# Patient Record
Sex: Female | Born: 1935 | Race: Black or African American | Hispanic: No | Smoking: Never smoker
Health system: Southern US, Community
[De-identification: ages and names within clinical notes are randomized; demographics above are authoritative.]

## PROBLEM LIST (undated history)

## (undated) ENCOUNTER — Emergency Department (HOSPITAL_BASED_OUTPATIENT_CLINIC_OR_DEPARTMENT_OTHER): Discharge status: Absent without leave | Payer: Medicare Other

## (undated) ENCOUNTER — Emergency Department (HOSPITAL_BASED_OUTPATIENT_CLINIC_OR_DEPARTMENT_OTHER): Disposition: A | Discharge status: Home / Self Care | Payer: Medicare Other

## (undated) DIAGNOSIS — C50919 Malignant neoplasm of unspecified site of unspecified female breast: Secondary | ICD-10-CM

## (undated) DIAGNOSIS — I10 Essential (primary) hypertension: Secondary | ICD-10-CM

## (undated) HISTORY — PX: BREAST LUMPECTOMY: SHX2

---

## 2011-06-27 ENCOUNTER — Emergency Department (HOSPITAL_BASED_OUTPATIENT_CLINIC_OR_DEPARTMENT_OTHER)
Admission: EM | Admit: 2011-06-27 | Discharge: 2011-06-28 | Disposition: A | Discharge status: Home / Self Care | Payer: Medicare Other | Attending: Emergency Medicine | Admitting: Emergency Medicine

## 2011-06-27 DIAGNOSIS — R040 Epistaxis: Secondary | ICD-10-CM

## 2011-06-27 DIAGNOSIS — I10 Essential (primary) hypertension: Secondary | ICD-10-CM | POA: Insufficient documentation

## 2011-06-27 HISTORY — DX: Essential (primary) hypertension: I10

## 2011-06-27 MED ORDER — OXYMETAZOLINE HCL 0.05 % NA SOLN
2.0000 | Freq: Once | NASAL | Status: DC
  Filled 2011-06-27: qty 15

## 2011-06-27 NOTE — ED Notes (Signed)
Pt states that she experienced a nosebleed today with postural drainage (of blood) and states that there was a large blood clot and then the blood stopped.  Pt does not have any bleeding occuring at this time.  In nad, family at side in triage.

## 2011-06-27 NOTE — ED Provider Notes (Signed)
Scribed for Hanley Seamen, MD, the patient was seen in room MH02/MH02 . This chart was scribed by Ellie Lunch. This patient's care was started at 11:49 PM.   CSN: [id number] Arrival date & time: 06/27/2011 11:39 PM  Chief Complaint  Patient presents with  . Epistaxis   HPI  Jane Cooper is a 77 y.o. female who presents to the Emergency Department complaining of epitaxis. Pt reports her nose began to bleed 2 hour ago(22:00) from both nostrils. She applied pressure and the bleeding stopped. The bleeding started again shortly after. Pt says she could feel blood flowing down her throat and states she removed a large blood clot from her throat. Following the clot, bleeding resolved and has not started again.  Pt denies swallowing blood. Patient denies n/v/d, shortness of breath or chest pain.      Past Medical History  Diagnosis Date  . Hypertension     History reviewed. No pertinent past surgical history.  MEDICATIONS:  Previous Medications   HYDROCHLOROTHIAZIDE (,MICROZIDE/HYDRODIURIL,) 12.5 MG CAPSULE    Take 12.5 mg by mouth daily.     UNKNOWN TO PATIENT    Pt states that she takes a medication for "my bones" once a week.      ALLERGIES:  Allergies as of 06/27/2011  . (No Known Allergies)    History reviewed. No pertinent family history.  History  Substance Use Topics  . Smoking status: Never Smoker   . Smokeless tobacco: Never Used  . Alcohol Use: No      Review of Systems  Respiratory: Negative for shortness of breath.   Cardiovascular: Negative for chest pain.  Gastrointestinal: Negative for nausea, vomiting and diarrhea.  All other systems reviewed and are negative.    Physical Exam  BP 151/84  Pulse 72  Temp(Src) 98.2 F (36.8 C) (Oral)  Resp 20  Ht 5' 5.5" (1.664 m)  Wt 135 lb (61.236 kg)  BMI 22.12 kg/m2  SpO2 96%  Physical Exam  Nursing note and vitals reviewed. Constitutional: She is oriented to person, place, and time. She appears  well-developed and well-nourished. No distress.  HENT:  Head: Normocephalic and atraumatic.       Slight irritation left turbinant.   Eyes: EOM are normal.       Bilateral arcus senilis   Neck: Normal range of motion.  Cardiovascular: Normal rate, regular rhythm, normal heart sounds and intact distal pulses.   Pulmonary/Chest: Effort normal and breath sounds normal.  Abdominal: Soft. There is no tenderness.  Musculoskeletal: Normal range of motion. She exhibits no edema.  Neurological: She is alert and oriented to person, place, and time.  Skin: Skin is warm and dry.  Psychiatric: She has a normal mood and affect. Her behavior is normal.   OTHER DATA REVIEWED: Nursing notes and vital signs reviewed.   DIAGNOSTIC STUDIES: Oxygen Saturation is 96% on room air, adequate by my interpretation.     ED COURSE / COORDINATION OF CARE: 11:52 PM Discussed pt care at bedside.    MDM:    PLAN:  Home  The patient is to return the emergency department if there is any worsening of symptoms. I have reviewed the discharge instructions with the patient.  CONDITION ON DISCHARGE: Stable   Procedures      12:45 AM Hemostatic after instillation of oxymetazoline. Patient will be sent home with oxymetazoline with instructions for use should bleeding recur. She is unable to achieve hemostasis she was advised to return.  Lary Eckardt L Ezrah Dembeck,  MD 06/28/11 [id number]

## 2019-03-11 ENCOUNTER — Emergency Department (HOSPITAL_BASED_OUTPATIENT_CLINIC_OR_DEPARTMENT_OTHER)
Admission: EM | Admit: 2019-03-11 | Discharge: 2019-03-11 | Disposition: A | Discharge status: Home / Self Care | Payer: Medicare Other | Attending: Emergency Medicine | Admitting: Emergency Medicine

## 2019-03-11 ENCOUNTER — Encounter (HOSPITAL_BASED_OUTPATIENT_CLINIC_OR_DEPARTMENT_OTHER): Payer: Self-pay | Admitting: Emergency Medicine

## 2019-03-11 ENCOUNTER — Other Ambulatory Visit: Payer: Self-pay

## 2019-03-11 DIAGNOSIS — J029 Acute pharyngitis, unspecified: Secondary | ICD-10-CM | POA: Diagnosis present

## 2019-03-11 DIAGNOSIS — K209 Esophagitis, unspecified without bleeding: Secondary | ICD-10-CM

## 2019-03-11 DIAGNOSIS — I10 Essential (primary) hypertension: Secondary | ICD-10-CM | POA: Diagnosis not present

## 2019-03-11 DIAGNOSIS — H9319 Tinnitus, unspecified ear: Secondary | ICD-10-CM | POA: Insufficient documentation

## 2019-03-11 DIAGNOSIS — R42 Dizziness and giddiness: Secondary | ICD-10-CM | POA: Diagnosis not present

## 2019-03-11 MED ORDER — LIDOCAINE VISCOUS HCL 2 % MT SOLN
15.0000 mL | Freq: Once | OROMUCOSAL | Status: AC
  Administered 2019-03-11: 01:00:00 15 mL via OROMUCOSAL
  Filled 2019-03-11: qty 15

## 2019-03-11 NOTE — ED Provider Notes (Signed)
Emergency Department Provider Note   I have reviewed the triage vital signs and the nursing notes.   HISTORY  Chief Complaint Sore Throat and Tinnitus   HPI Jane Cooper is a 85 y.o. female with a history of hypertension and reflux who presents the emergency department today with multiple complaints.  The primary reason for coming is that she will intermittently feel a lump in her throat.  She states is not necessarily associated with swallowing or breathing but every once while she has had this sensation of a foreign body.  She is able to swallow without difficulty.  She not had any nausea, vomiting.  She has been treated for reflux recently with omeprazole which seems to have helped the indigestion symptoms and somewhat the throat stuff as well.  She intermittently have some sinus drainage but nothing persistent.  Over the last couple weeks she had a couple episodes of what sounds like near syncope where she feels really lightheaded has little bit of tinnitus but never actually passes out.  This is not persistent nor is it every day.  Had some chest pressure a week or 2 ago but this is not consistent either.  No fevers or cough.  No sick contacts.  No other abdominal symptoms.  When referencing her hypertension patient does not have severe headache, vision changes, dyspnea or decrease in urination.   No other associated or modifying symptoms.    Past Medical History:  Diagnosis Date  . Hypertension     There are no active problems to display for this patient.   History reviewed. No pertinent surgical history.  Current Outpatient Rx  . Order #: [id number] Class: Historical Med  . Order #: [id number] Class: Historical Med    Allergies Patient has no known allergies.  History reviewed. No pertinent family history.  Social History Social History   Tobacco Use  . Smoking status: Never Smoker  . Smokeless tobacco: Never Used  Substance Use Topics  . Alcohol use: No  . Drug  use: No    Review of Systems  All other systems negative except as documented in the HPI. All pertinent positives and negatives as reviewed in the HPI. ____________________________________________   PHYSICAL EXAM:  VITAL SIGNS: ED Triage Vitals  Enc Vitals Group     BP 03/11/19 0113 (!) 163/87     Pulse Rate 03/11/19 0113 68     Resp 03/11/19 0113 18     Temp 03/11/19 0113 98.1 F (36.7 C)     Temp Source 03/11/19 0113 Oral     SpO2 03/11/19 0113 100 %     Weight 03/11/19 0114 115 lb (52.2 kg)     Height 03/11/19 0114 5\' 5"  (1.651 m)     Head Circumference --      Peak Flow --      Pain Score 03/11/19 0114 0     Pain Loc --      Pain Edu? --      Excl. in [place]? --     Constitutional: Alert and oriented. Well appearing and in no acute distress. Eyes: Conjunctivae are normal. PERRL. EOMI. Head: Atraumatic. Nose: No congestion/rhinnorhea. Mouth/Throat/Sinus: Mucous membranes are moist.  Oropharynx non-erythematous. No sinus ttp. No throat fullness, ttp or thyromegaly. Neck: No stridor.  No meningeal signs.   Cardiovascular: Normal rate, regular rhythm. Good peripheral circulation. Grossly normal heart sounds.   Respiratory: Normal respiratory effort.  No retractions. Lungs CTAB. Gastrointestinal: Soft and nontender. No distention.  Musculoskeletal: No  lower extremity tenderness nor edema. No gross deformities of extremities. Neurologic:  Normal speech and language. No gross focal neurologic deficits are appreciated. No altered mental status, able to give full seemingly accurate history.  Face is symmetric, EOM's intact, pupils equal and reactive, vision intact, tongue and uvula midline without deviation. Upper and Lower extremity motor 5/5, intact pain perception in distal extremities, 2+ reflexes in biceps, patella and achilles tendons. Able to perform finger to nose normal with both hands. Walks without assistance or evident ataxia.  Skin:  Skin is warm, dry and intact. No  rash noted.  ____________________________________________   EKG   EKG Interpretation  Date/Time:  Sunday Mar 11 2019 01:37:50 EDT Ventricular Rate:  56 PR Interval:    QRS Duration: 99 QT Interval:  450 QTC Calculation: 435 R Axis:   -32 Text Interpretation:  Sinus rhythm Prolonged PR interval Probable left atrial enlargement Left ventricular hypertrophy No significant change was found Confirmed by Merrily Pew [id number]) on 03/11/2019 1:41:34 AM      ____________________________________________  INITIAL IMPRESSION / ASSESSMENT AND PLAN / ED COURSE  Suspect esophagitis from long standing indigestion. ECG 2/2 near syncope and vague CP/neck pain and appears normal. Will dc to fu w/ GI if not improving.   Pertinent labs & imaging results that were available during my care of the patient were reviewed by me and considered in my medical decision making (see chart for details).  A medical screening exam was performed and I feel the patient has had an appropriate workup for their chief complaint at this time and likelihood of emergent condition existing is low. They have been counseled on decision, discharge, follow up and which symptoms necessitate immediate return to the emergency department. They or their family verbally stated understanding and agreement with plan and discharged in stable condition.   ____________________________________________  FINAL CLINICAL IMPRESSION(S) / ED DIAGNOSES  Final diagnoses:  Esophagitis    MEDICATIONS GIVEN DURING THIS VISIT:  Medications  lidocaine (XYLOCAINE) 2 % viscous mouth solution 15 mL (15 mLs Mouth/Throat Given 03/11/19 0121)     NEW OUTPATIENT MEDICATIONS STARTED DURING THIS VISIT:  Discharge Medication List as of 03/11/2019  1:47 AM      Note:  This note was prepared with assistance of Dragon voice recognition software. Occasional wrong-word or sound-a-like substitutions may have occurred due to the inherent limitations of voice  recognition software.   Nuvia Hileman, Corene Cornea, MD 03/11/19 [phone]

## 2019-03-11 NOTE — ED Triage Notes (Signed)
  Patient comes in with throat pain and ringing in her ears.  Patient states she feels like something is stuck in her throat but does not have difficulty eating or drinking.  Patient was recently treated for reflux and prescribed omeprazole. Patient states she has not been febrile or around anyone that has been sick.  Patient is A&O x4.

## 2019-06-17 ENCOUNTER — Encounter (HOSPITAL_BASED_OUTPATIENT_CLINIC_OR_DEPARTMENT_OTHER): Payer: Self-pay | Admitting: Emergency Medicine

## 2019-06-17 ENCOUNTER — Emergency Department (HOSPITAL_BASED_OUTPATIENT_CLINIC_OR_DEPARTMENT_OTHER)
Admission: EM | Admit: 2019-06-17 | Discharge: 2019-06-17 | Disposition: A | Discharge status: Home / Self Care | Payer: Medicare Other | Attending: Emergency Medicine | Admitting: Emergency Medicine

## 2019-06-17 ENCOUNTER — Emergency Department (HOSPITAL_BASED_OUTPATIENT_CLINIC_OR_DEPARTMENT_OTHER): Payer: Medicare Other

## 2019-06-17 ENCOUNTER — Other Ambulatory Visit: Payer: Self-pay

## 2019-06-17 DIAGNOSIS — H6121 Impacted cerumen, right ear: Secondary | ICD-10-CM | POA: Insufficient documentation

## 2019-06-17 DIAGNOSIS — I10 Essential (primary) hypertension: Secondary | ICD-10-CM | POA: Insufficient documentation

## 2019-06-17 DIAGNOSIS — Z79899 Other long term (current) drug therapy: Secondary | ICD-10-CM | POA: Diagnosis not present

## 2019-06-17 DIAGNOSIS — H9201 Otalgia, right ear: Secondary | ICD-10-CM

## 2019-06-17 DIAGNOSIS — Z853 Personal history of malignant neoplasm of breast: Secondary | ICD-10-CM | POA: Insufficient documentation

## 2019-06-17 HISTORY — DX: Malignant neoplasm of unspecified site of unspecified female breast: C50.919

## 2019-06-17 MED ORDER — NEOMYCIN-POLYMYXIN-HC 3.5-10000-1 OT SUSP: 4.0000 [drp] | Freq: Three times a day (TID) | OTIC | 0 refills | Status: AC

## 2019-06-17 MED ORDER — ACETAMINOPHEN 500 MG PO TABS
1000.0000 mg | ORAL_TABLET | Freq: Once | ORAL | Status: AC
  Administered 2019-06-17: 1000 mg via ORAL
  Filled 2019-06-17: qty 2

## 2019-06-17 NOTE — ED Triage Notes (Signed)
Pt sts RT ear began hurting Fri; it doesn't hurt as much now, but it makes her feel funny in her head sometimes; denies sxs at present, sts sxs are intermittent

## 2019-06-17 NOTE — ED Notes (Signed)
Patient transported to CT 

## 2019-06-17 NOTE — ED Provider Notes (Signed)
[facility] EMERGENCY DEPARTMENT Provider Note   CSN: [id number] Arrival date & time: 06/17/19  1000     History   Chief Complaint Chief Complaint  Patient presents with  . Otalgia    HPI Jane Cooper is a 85 y.o. female.     The history is provided by the patient.  Otalgia Location:  Right Behind ear:  No abnormality Quality:  Aching and shooting Severity:  Moderate Onset quality:  Gradual Timing:  Constant Progression:  Unchanged Chronicity:  New Context: not recent URI and not water in ear   Relieved by:  Nothing Worsened by:  Nothing Ineffective treatments:  None tried Associated symptoms: no abdominal pain, no congestion, no cough, no diarrhea, no ear discharge, no fever, no headaches, no hearing loss, no neck pain, no rash, no rhinorrhea, no sore throat, no tinnitus and no vomiting   Risk factors: no recent travel   Head and ear feel full.  No tinnitus.  No weakness no numbness no changes in vision or speech.  No f/c/r/.  No headache.  Chest pain no shortness of breath.    Past Medical History:  Diagnosis Date  . Breast cancer ([place])   . Hypertension     There are no active problems to display for this patient.   Past Surgical History:  Procedure Laterality Date  . BREAST LUMPECTOMY Right      OB History   No obstetric history on file.      Home Medications    Prior to Admission medications   Medication Sig Start Date End Date Taking? Authorizing Provider  omeprazole (PRILOSEC) 20 MG capsule TAKE 1 CAPSULE BY MOUTH EVERY DAY IN THE MORNING 30 MINUTES BEFORE BREAKFAST 04/11/19  Yes [provider]  Cholecalciferol (VITAMIN D3) 25 MCG (1000 UT) CAPS Take by mouth.    [provider]  hydrochlorothiazide (,MICROZIDE/HYDRODIURIL,) 12.5 MG capsule Take 12.5 mg by mouth daily.      [provider]  letrozole (FEMARA) 2.5 MG tablet Take 2.5 mg by mouth daily. 01/19/19   [provider]  UNKNOWN TO PATIENT Pt  states that she takes a medication for "my bones" once a week.     [provider]    Family History No family history on file.  Social History Social History   Tobacco Use  . Smoking status: Never Smoker  . Smokeless tobacco: Never Used  Substance Use Topics  . Alcohol use: No  . Drug use: No     Allergies   Patient has no known allergies.   Review of Systems Review of Systems  Constitutional: Negative for fever.  HENT: Positive for ear pain. Negative for congestion, ear discharge, hearing loss, rhinorrhea, sore throat and tinnitus.   Eyes: Negative for photophobia and visual disturbance.  Respiratory: Negative for cough.   Cardiovascular: Negative for chest pain.  Gastrointestinal: Negative for abdominal pain, diarrhea and vomiting.  Musculoskeletal: Negative for neck pain.  Skin: Negative for rash.  Neurological: Negative for dizziness, tremors, seizures, syncope, facial asymmetry, speech difficulty, weakness, light-headedness, numbness and headaches.  All other systems reviewed and are negative.    Physical Exam Updated Vital Signs BP 138/85 (BP Location: Right Arm)   Pulse 81   Temp 98 F (36.7 C) (Oral)   Resp 18   Ht 5' 5.5" (1.664 m)   Wt 49.5 kg   SpO2 100%   BMI 17.89 kg/m   Physical Exam Vitals signs and nursing note reviewed.  Constitutional:  General: She is not in acute distress.    Appearance: She is normal weight.  HENT:     Head: Normocephalic and atraumatic.     Left Ear: Tympanic membrane normal.     Ears:     Comments: Impaction in the right canal.  Canal is mildly red    Nose: Nose normal.     Mouth/Throat:     Mouth: Mucous membranes are moist.     Pharynx: Oropharynx is clear.  Eyes:     Extraocular Movements: Extraocular movements intact.     Conjunctiva/sclera: Conjunctivae normal.     Pupils: Pupils are equal, round, and reactive to light.  Neck:     Musculoskeletal: Normal range of motion and neck supple.   Cardiovascular:     Rate and Rhythm: Normal rate and regular rhythm.     Pulses: Normal pulses.     Heart sounds: Normal heart sounds.  Pulmonary:     Effort: Pulmonary effort is normal.     Breath sounds: Normal breath sounds.  Abdominal:     General: Abdomen is flat. Bowel sounds are normal.     Tenderness: There is no abdominal tenderness. There is no guarding.  Musculoskeletal: Normal range of motion.  Skin:    General: Skin is warm and dry.     Capillary Refill: Capillary refill takes less than 2 seconds.  Neurological:     General: No focal deficit present.     Mental Status: She is alert and oriented to person, place, and time.     Cranial Nerves: No cranial nerve deficit.     Motor: No weakness.     Gait: Gait normal.     Deep Tendon Reflexes: Reflexes normal.  Psychiatric:        Mood and Affect: Mood normal.        Behavior: Behavior normal.      ED Treatments / Results  Labs (all labs ordered are listed, but only abnormal results are displayed) Labs Reviewed - No data to display  EKG None  Radiology No results found.  Procedures Procedures (including critical care time)  Medications Ordered in ED Medications  acetaminophen (TYLENOL) tablet 1,000 mg (1,000 mg Oral Given 06/17/19 1047)     Irrigated and copious cerumen came out feels better.  I do not believe this is a stroke.  She states the feeling in her ear and head went away post irrigation.  No signs of stroke on exam.  Will start ear drops.    Final Clinical Impressions(s) / ED Diagnoses   Return for intractable cough, coughing up blood,fevers >100.4 unrelieved by medication, shortness of breath, intractable vomiting, chest pain, shortness of breath, weakness,numbness, changes in speech, facial asymmetry,abdominal pain, passing out,Inability to tolerate liquids or food, cough, altered mental status or any concerns. No signs of systemic illness or infection. The patient is nontoxic-appearing  on exam and vital signs are within normal limits.   I have reviewed the triage vital signs and the nursing notes. Pertinent labs &imaging results that were available during my care of the patient were reviewed by me and considered in my medical decision making (see chart for details).  After history, exam, and medical workup I feel the patient has been appropriately medically screened and is safe for discharge home. Pertinent diagnoses were discussed with the patient. Patient was given return precautions   Nayda Riesen, MD 06/17/19 1240

## 2019-06-17 NOTE — ED Notes (Signed)
RT ear irrigated with warm water; sm amt of wax obtained

## 2020-06-16 ENCOUNTER — Encounter (HOSPITAL_BASED_OUTPATIENT_CLINIC_OR_DEPARTMENT_OTHER): Payer: Self-pay | Admitting: *Deleted

## 2020-06-16 ENCOUNTER — Emergency Department (HOSPITAL_BASED_OUTPATIENT_CLINIC_OR_DEPARTMENT_OTHER)
Admission: EM | Admit: 2020-06-16 | Discharge: 2020-06-16 | Disposition: A | Discharge status: Home / Self Care | Payer: Medicare Other | Attending: Emergency Medicine | Admitting: Emergency Medicine

## 2020-06-16 ENCOUNTER — Other Ambulatory Visit: Payer: Self-pay

## 2020-06-16 DIAGNOSIS — I10 Essential (primary) hypertension: Secondary | ICD-10-CM | POA: Diagnosis not present

## 2020-06-16 DIAGNOSIS — Z853 Personal history of malignant neoplasm of breast: Secondary | ICD-10-CM | POA: Diagnosis not present

## 2020-06-16 DIAGNOSIS — R202 Paresthesia of skin: Secondary | ICD-10-CM | POA: Insufficient documentation

## 2020-06-16 LAB — CBC WITH DIFFERENTIAL/PLATELET
Abs Immature Granulocytes: 0.01 10*3/uL (ref 0.00–0.07)
Basophils Absolute: 0.1 10*3/uL (ref 0.0–0.1)
Basophils Relative: 1 %
Eosinophils Absolute: 0.1 10*3/uL (ref 0.0–0.5)
Eosinophils Relative: 3 %
HCT: 37.3 % (ref 36.0–46.0)
Hemoglobin: 11.9 g/dL — ABNORMAL LOW (ref 12.0–15.0)
Immature Granulocytes: 0 %
Lymphocytes Relative: 31 %
Lymphs Abs: 1.7 10*3/uL (ref 0.7–4.0)
MCH: 25.5 pg — ABNORMAL LOW (ref 26.0–34.0)
MCHC: 31.9 g/dL (ref 30.0–36.0)
MCV: 80 fL (ref 80.0–100.0)
Monocytes Absolute: 0.6 10*3/uL (ref 0.1–1.0)
Monocytes Relative: 10 %
Neutro Abs: 3.1 10*3/uL (ref 1.7–7.7)
Neutrophils Relative %: 55 %
Platelets: 193 10*3/uL (ref 150–400)
RBC: 4.66 MIL/uL (ref 3.87–5.11)
RDW: 15 % (ref 11.5–15.5)
WBC: 5.6 10*3/uL (ref 4.0–10.5)
nRBC: 0 % (ref 0.0–0.2)

## 2020-06-16 LAB — BASIC METABOLIC PANEL
Anion gap: 9 (ref 5–15)
BUN: 17 mg/dL (ref 8–23)
CO2: 25 mmol/L (ref 22–32)
Calcium: 9.4 mg/dL (ref 8.9–10.3)
Chloride: 105 mmol/L (ref 98–111)
Creatinine, Ser: 0.83 mg/dL (ref 0.44–1.00)
GFR calc Af Amer: >60 mL/min (ref >60)
GFR calc non Af Amer: >60 mL/min (ref >60)
Glucose, Bld: 84 mg/dL (ref 70–99)
Potassium: 4.6 mmol/L (ref 3.5–5.1)
Sodium: 139 mmol/L (ref 135–145)

## 2020-06-16 LAB — VITAMIN B12: Vitamin B-12: 592 pg/mL (ref 180–914)

## 2020-06-16 LAB — CBG MONITORING, ED: Glucose-Capillary: 85 mg/dL (ref 70–99)

## 2020-06-16 NOTE — ED Notes (Signed)
D/c IV in L ac

## 2020-06-16 NOTE — ED Triage Notes (Addendum)
Acid reflux. Dry mouth for the past few mornings. Tingling in her feet for a long time per pt. Now she is having trembling in her hands. She drove herself here.

## 2020-06-16 NOTE — Discharge Instructions (Addendum)
Your electrolytes and blood work is reassuring.   Please follow closely with your primary care provider to discuss your visit today. Return for new or concerning symptoms.

## 2020-06-16 NOTE — ED Provider Notes (Signed)
[facility] EM[facility] DEPARTMENT Provider Note   CSN: [id number] Arrival date & time: 06/16/20  1114     History Chief Complaint  Patient presents with  . Multiple Complaints    Jane Cooper is a 86 y.o. female w PMHx GERD, HTN, breast cancer s/p lumpectomy, presenting to the ED with complaint of intermittent full body tingling sensation that has been occurring for a few weeks. She states she has had tingling to b/l for a few years, and a few weeks ago it began occurring intermittently to both arms and torso. She denies associated chest pain, shortness of breath, nausea, diaphoresis, numbness or weakness, difficulty seeing or speaking.  She has not seen her PCP for this.  She denies any new medications or recent illness.  She does endorse dry mouth.  The history is provided by the patient.       Past Medical History:  Diagnosis Date  . Breast cancer ([place])   . Hypertension     There are no problems to display for this patient.   Past Surgical History:  Procedure Laterality Date  . BREAST LUMPECTOMY Right      OB History   No obstetric history on file.     No family history on file.  Social History   Tobacco Use  . Smoking status: Never Smoker  . Smokeless tobacco: Never Used  Substance Use Topics  . Alcohol use: No  . Drug use: No    Home Medications Prior to Admission medications   Medication Sig Start Date End Date Taking? Authorizing Provider  Cholecalciferol (VITAMIN D3) 25 MCG (1000 UT) CAPS Take by mouth.    [provider]  hydrochlorothiazide (,MICROZIDE/HYDRODIURIL,) 12.5 MG capsule Take 12.5 mg by mouth daily.      [provider]  letrozole (FEMARA) 2.5 MG tablet Take 2.5 mg by mouth daily. 01/19/19   [provider]  neomycin-polymyxin-hydrocortisone (CORTISPORIN) 3.5-10000-1 OTIC suspension Place 4 drops into both ears 3 (three) times daily. X 7 days 06/17/19   Palumbo, April, MD  omeprazole (PRILOSEC) 20 MG  capsule TAKE 1 CAPSULE BY MOUTH EVERY DAY IN THE MORNING 30 MINUTES BEFORE BREAKFAST 04/11/19   [provider]  UNKNOWN TO PATIENT Pt states that she takes a medication for "my bones" once a week.     [provider]    Allergies    Patient has no known allergies.  Review of Systems   Review of Systems  All other systems reviewed and are negative.   Physical Exam Updated Vital Signs BP (!) 166/84 (BP Location: Right Arm)   Pulse (!) 54   Temp 98.1 F (36.7 C) (Oral)   Resp 13   Ht 5\' 5"  (1.651 m)   Wt 52.6 kg   SpO2 100%   BMI 19.29 kg/m   Physical Exam Vitals and nursing note reviewed.  Constitutional:      Appearance: She is well-developed.     Comments: Pt is well-appearing, no distress noted  HENT:     Head: Normocephalic and atraumatic.  Eyes:     Conjunctiva/sclera: Conjunctivae normal.  Cardiovascular:     Rate and Rhythm: Normal rate and regular rhythm.     Pulses: Normal pulses.     Comments: Nl DP and radial pulses b/l Pulmonary:     Effort: Pulmonary effort is normal. No respiratory distress.     Breath sounds: Normal breath sounds.  Abdominal:     Palpations: Abdomen is soft.  Musculoskeletal:  Right lower leg: No edema.     Left lower leg: No edema.  Skin:    General: Skin is warm.  Neurological:     Mental Status: She is alert.  Psychiatric:        Behavior: Behavior normal.     ED Results / Procedures / Treatments   Labs (all labs ordered are listed, but only abnormal results are displayed) Labs Reviewed  CBC WITH DIFFERENTIAL/PLATELET - Abnormal; Notable for the following components:      Result Value   Hemoglobin 11.9 (*)    MCH 25.5 (*)    All other components within normal limits  BASIC METABOLIC PANEL  VITAMIN O13  CBG MONITORING, ED    EKG EKG Interpretation  Date/Time:  Monday June 16 2020 14:16:09 EDT Ventricular Rate:  49 PR Interval:    QRS Duration: 96 QT Interval:  483 QTC Calculation: 436 R  Axis:   -7 Text Interpretation: Sinus bradycardia Atrial premature complex Prolonged PR interval No significant change since prior 5/20 Confirmed by Aletta Edouard [phone]) on 06/16/2020 2:18:25 PM   Radiology No results found.  Procedures Procedures (including critical care time)  Medications Ordered in ED Medications - No data to display  ED Course  I have reviewed the triage vital signs and the nursing notes.  Pertinent labs & imaging results that were available during my care of the patient were reviewed by me and considered in my medical decision making (see chart for details).  Clinical Course as of Jun 16 1558  Mon Jun 16, 2020  1416 Pleasant 86 year old with longstanding tingling in her lower extremities now feeling the tingling going up her body and feeling nervous.  Has been taking her medicines regularly.  Getting some screening labs EKG.   [MB]    Clinical Course User Index [MB] Hayden Rasmussen, MD   MDM Rules/Calculators/A&P                          Pt presenting with multiple weeks of intermittent full body tingling sensation.  No accompanying symptoms concerning for emergent cardiac or neurologic etiology.  On exam she is very well-appearing in no distress.  Signs are stable.  No new medications and  has been compliant with them.   Labs obtained to evaluate for electrolyte disturbances and are very reassuring.  B12 sent and pending.  Recommend patient follow-up with PCP regarding visit today, also to follow B12 result.  She is agreeable plan, well-appearing, appropriate for discharge.  Patient discussed with and evaluate by Dr. Melina Copa, agrees with work-up and care plan at this time. Final Clinical Impression(s) / ED Diagnoses Final diagnoses:  Tingling of skin    Rx / DC Orders ED Discharge Orders    None       Cicley Ganesh, [place] N, PA-C 06/16/20 1600    Hayden Rasmussen, MD 06/16/20 2132

## 2021-06-02 IMAGING — CT CT HEAD WITHOUT CONTRAST
3 series · 16 of 46 positions shown, 19 images · non-contrast
Comparison: No priors.

CLINICAL DATA: 88-year-old female with history of pain in the right
ear.

EXAM:
CT HEAD WITHOUT CONTRAST
TECHNIQUE: Contiguous axial images were obtained from the base of the skull
through the vertex without intravenous contrast.

[Series 2: head wo · axial · 0.41mm/px · z∈[-68,+52]mm · 10 of 29 slices shown, 13 images]
[im 3/29  brain]
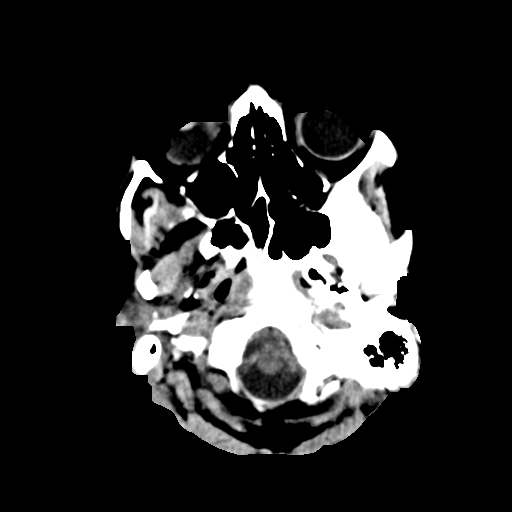
[im 3/29  bone]
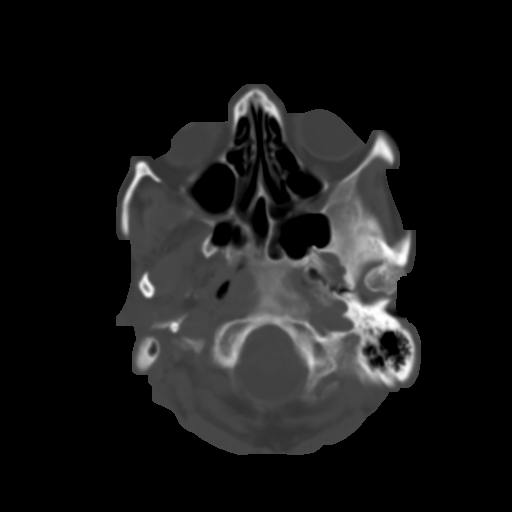
[im 6/29  brain]
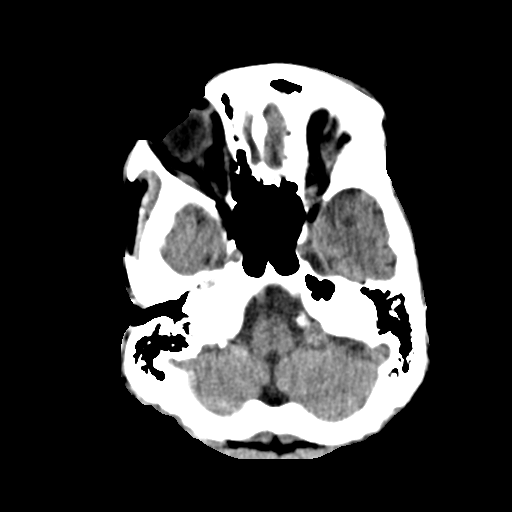
[im 8/29  brain]
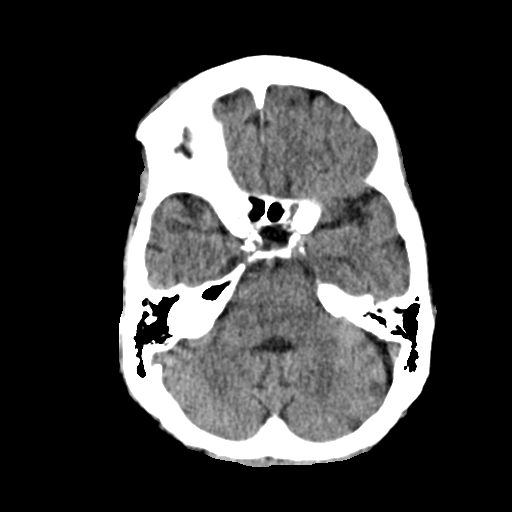
[im 11/29  brain]
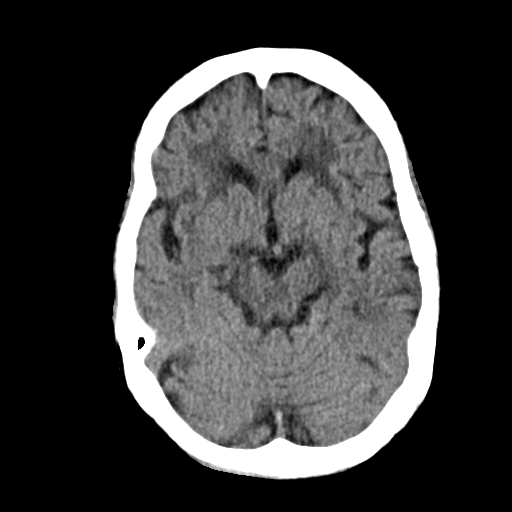
[im 14/29  brain]
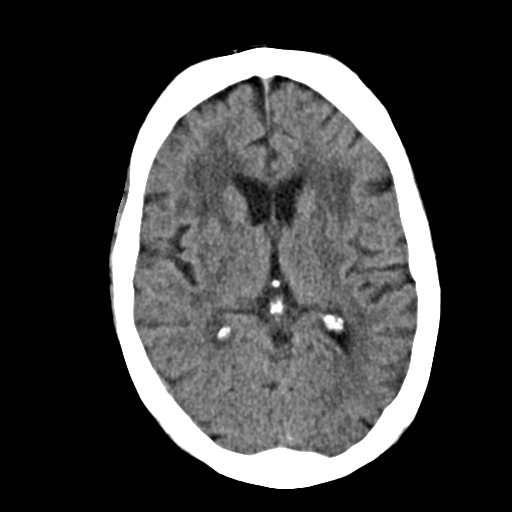
[im 14/29  bone]
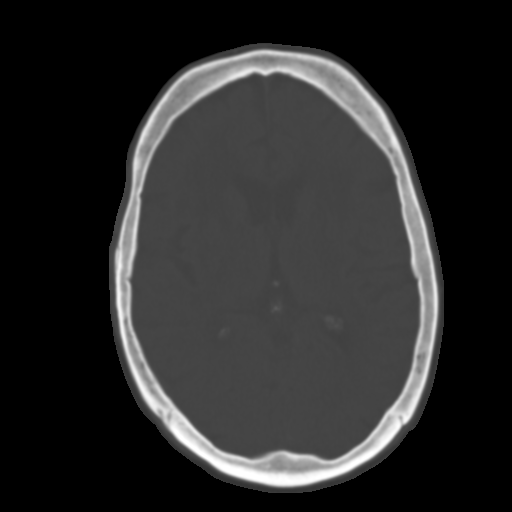
[im 16/29  brain]
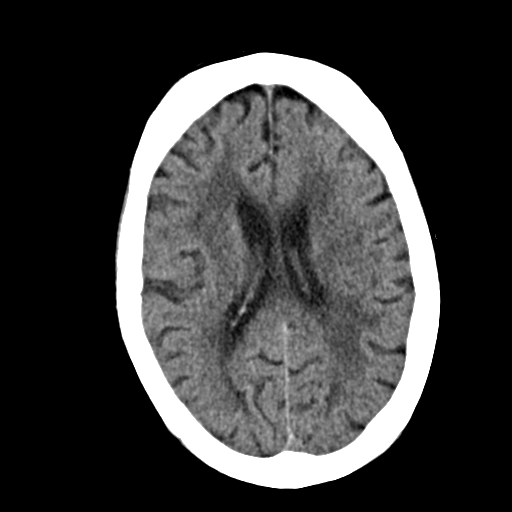
[im 19/29  brain]
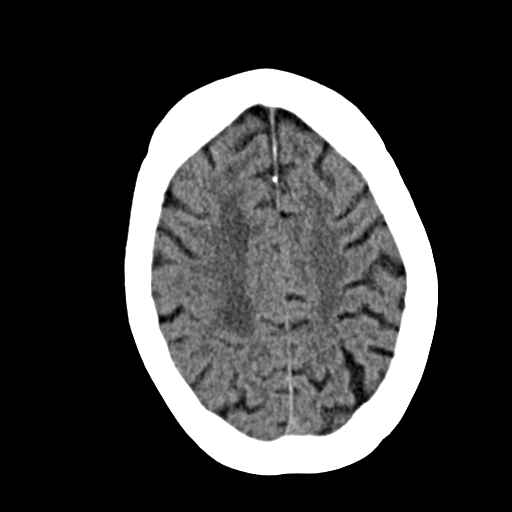
[im 22/29  brain]
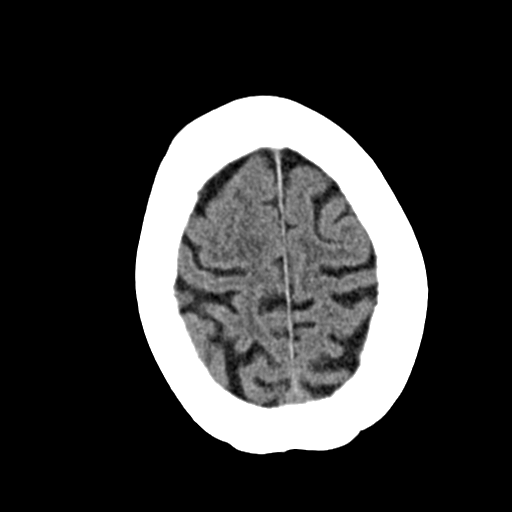
[im 24/29  brain]
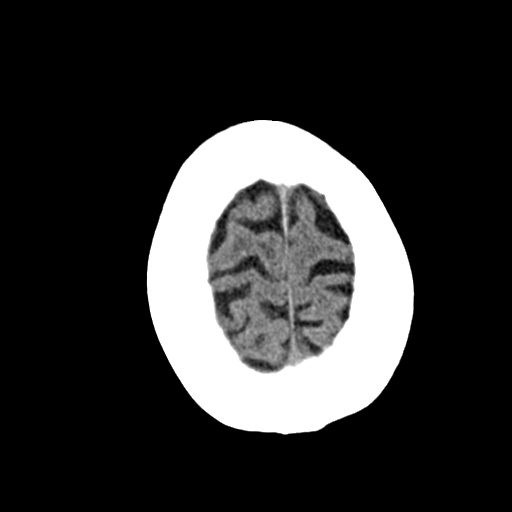
[im 24/29  bone]
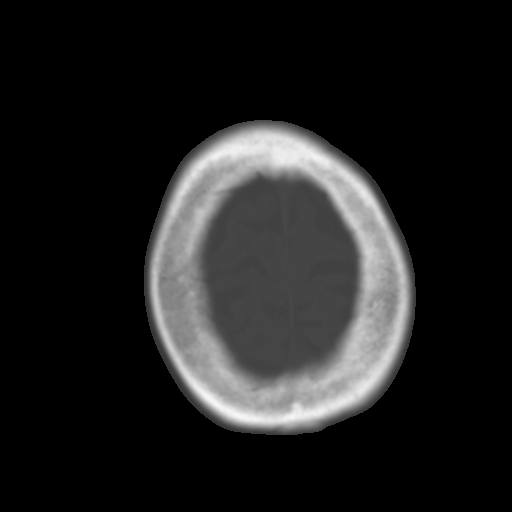
[im 27/29  brain]
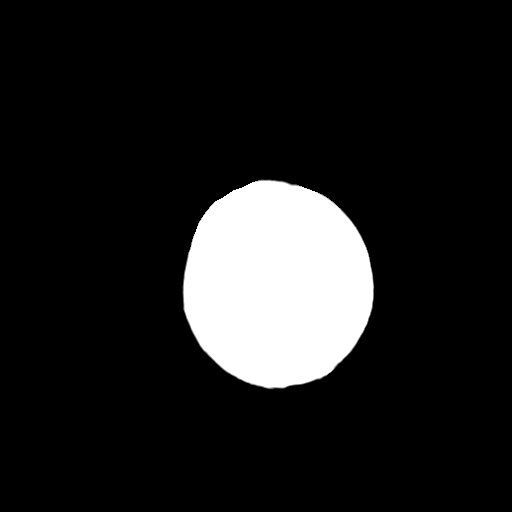

[Series 4: coronal soft · coronal · 0.34mm/px · 3 of 67 slices shown]
[im 23/67  brain]
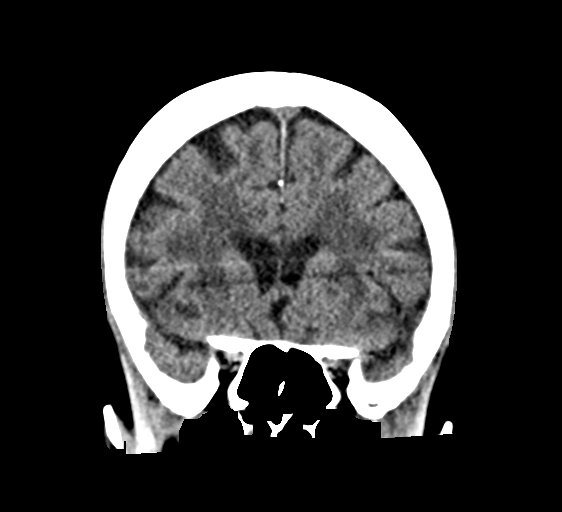
[im 30/67  brain]
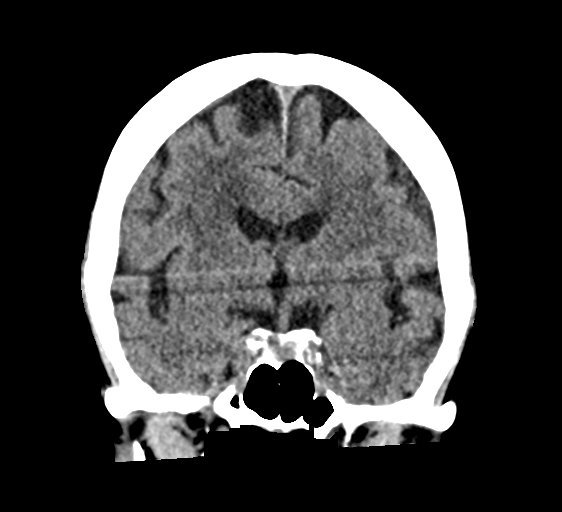
[im 37/67  brain]
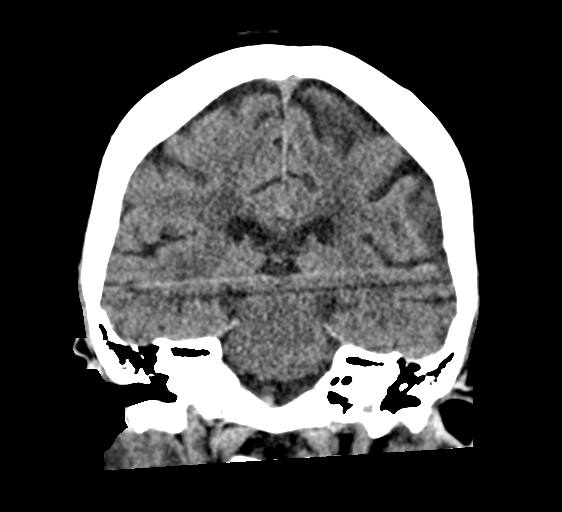

[Series 5: sag soft · sagittal · 0.34mm/px · 3 of 61 slices shown]
[im 21/61  brain]
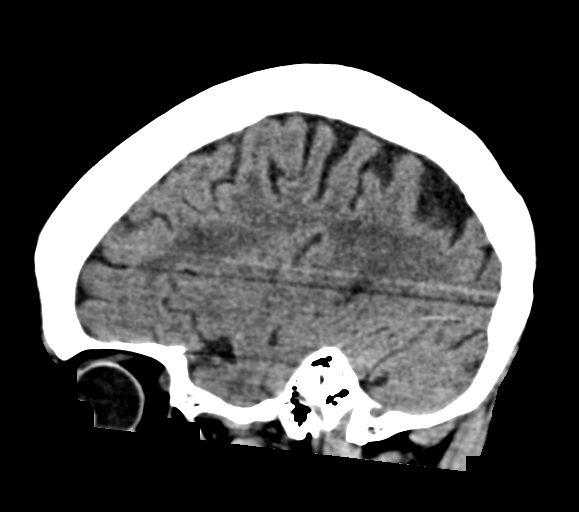
[im 31/61  brain]
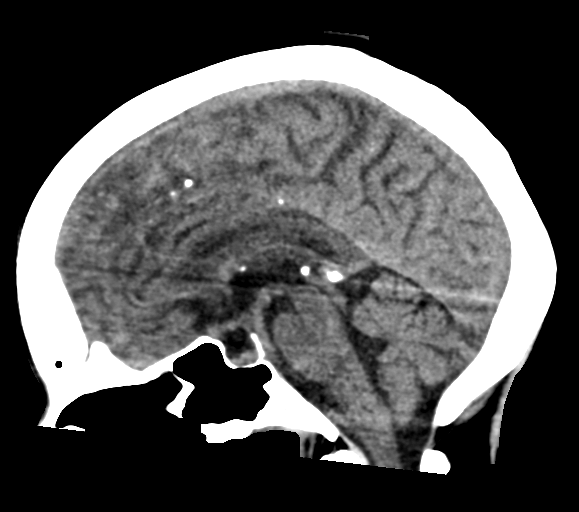
[im 41/61  brain]
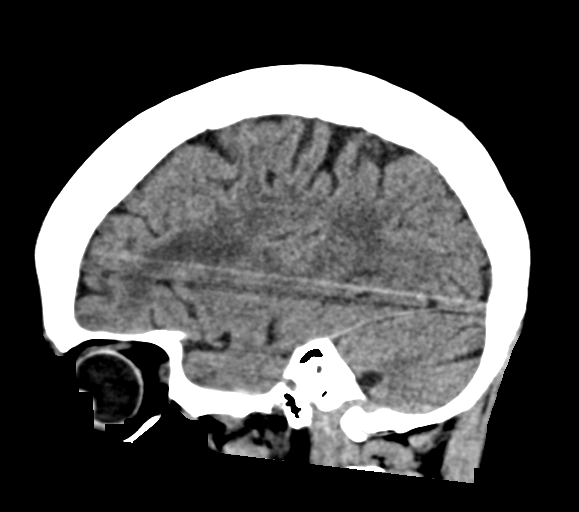

[16 of 46 positions shown; findings below may reference images not displayed]

FINDINGS: Brain: Mild cerebral atrophy. Patchy and confluent areas of
decreased attenuation are noted throughout the deep and
periventricular white matter of the cerebral hemispheres
bilaterally, compatible with chronic microvascular ischemic disease.
No evidence of acute infarction, hemorrhage, hydrocephalus,
extra-axial collection or mass lesion/mass effect.

Vascular: No hyperdense vessel or unexpected calcification.

Skull: Normal. Negative for fracture or focal lesion.

Sinuses/Orbits: No acute finding.

Other: None.
IMPRESSION: 1. No acute intracranial abnormality.
2. Mild cerebral atrophy with extensive chronic microvascular
ischemic changes in the cerebral white matter, as above.

## 2023-05-31 ENCOUNTER — Other Ambulatory Visit: Payer: Self-pay

## 2023-05-31 ENCOUNTER — Encounter (HOSPITAL_BASED_OUTPATIENT_CLINIC_OR_DEPARTMENT_OTHER): Payer: Self-pay | Admitting: Emergency Medicine

## 2023-05-31 ENCOUNTER — Emergency Department (HOSPITAL_BASED_OUTPATIENT_CLINIC_OR_DEPARTMENT_OTHER): Payer: Medicare Other

## 2023-05-31 ENCOUNTER — Emergency Department (HOSPITAL_BASED_OUTPATIENT_CLINIC_OR_DEPARTMENT_OTHER)
Admission: EM | Admit: 2023-05-31 | Discharge: 2023-05-31 | Disposition: A | Discharge status: Home / Self Care | Payer: Medicare Other | Attending: Emergency Medicine | Admitting: Emergency Medicine

## 2023-05-31 DIAGNOSIS — I10 Essential (primary) hypertension: Secondary | ICD-10-CM | POA: Diagnosis not present

## 2023-05-31 DIAGNOSIS — R079 Chest pain, unspecified: Secondary | ICD-10-CM | POA: Diagnosis not present

## 2023-05-31 DIAGNOSIS — Z853 Personal history of malignant neoplasm of breast: Secondary | ICD-10-CM | POA: Insufficient documentation

## 2023-05-31 DIAGNOSIS — Y92410 Unspecified street and highway as the place of occurrence of the external cause: Secondary | ICD-10-CM | POA: Insufficient documentation

## 2023-05-31 NOTE — ED Notes (Signed)
Reviewed discharge instructions and follow up recommendations with pt. Pt states understanding. Ambulatory at discharge with family

## 2023-05-31 NOTE — Discharge Instructions (Addendum)
Please take tylenol for pain. I recommend close follow-up with PCP for reevaluation.  Please do not hesitate to return to emergency department if worrisome signs symptoms we discussed become apparent.
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
## 2023-06-11 ENCOUNTER — Encounter (HOSPITAL_BASED_OUTPATIENT_CLINIC_OR_DEPARTMENT_OTHER): Payer: Self-pay | Admitting: Emergency Medicine

## 2023-06-11 ENCOUNTER — Emergency Department (HOSPITAL_BASED_OUTPATIENT_CLINIC_OR_DEPARTMENT_OTHER): Payer: Medicare Other

## 2023-06-11 ENCOUNTER — Emergency Department (HOSPITAL_BASED_OUTPATIENT_CLINIC_OR_DEPARTMENT_OTHER)
Admission: EM | Admit: 2023-06-11 | Discharge: 2023-06-11 | Disposition: A | Discharge status: Home / Self Care | Payer: Medicare Other | Attending: Emergency Medicine | Admitting: Emergency Medicine

## 2023-06-11 ENCOUNTER — Other Ambulatory Visit: Payer: Self-pay

## 2023-06-11 DIAGNOSIS — Z853 Personal history of malignant neoplasm of breast: Secondary | ICD-10-CM | POA: Insufficient documentation

## 2023-06-11 DIAGNOSIS — R0789 Other chest pain: Secondary | ICD-10-CM | POA: Diagnosis present

## 2023-06-11 DIAGNOSIS — I10 Essential (primary) hypertension: Secondary | ICD-10-CM | POA: Insufficient documentation

## 2023-06-11 MED ORDER — LIDOCAINE 5 % EX PTCH: 1.0000 | MEDICATED_PATCH | CUTANEOUS | 0 refills | Status: AC
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
